# Patient Record
Sex: Female | Born: 1945 | Race: White | Hispanic: No | Marital: Married | Smoking: Never smoker
Health system: Southern US, Academic
[De-identification: ages and names within clinical notes are randomized; demographics above are authoritative.]

## PROBLEM LIST (undated history)

## (undated) DIAGNOSIS — E119 Type 2 diabetes mellitus without complications: Secondary | ICD-10-CM

## (undated) DIAGNOSIS — E782 Mixed hyperlipidemia: Secondary | ICD-10-CM

## (undated) DIAGNOSIS — E559 Vitamin D deficiency, unspecified: Secondary | ICD-10-CM

## (undated) DIAGNOSIS — I10 Essential (primary) hypertension: Secondary | ICD-10-CM

## (undated) DIAGNOSIS — E039 Hypothyroidism, unspecified: Secondary | ICD-10-CM

## (undated) HISTORY — DX: Mixed hyperlipidemia: E78.2

## (undated) HISTORY — DX: Vitamin D deficiency, unspecified: E55.9

## (undated) HISTORY — DX: Type 2 diabetes mellitus without complications: E11.9

## (undated) HISTORY — DX: Essential (primary) hypertension: I10

## (undated) HISTORY — PX: HX BREAST BIOPSY: SHX20

## (undated) HISTORY — DX: Hypothyroidism, unspecified: E03.9

## (undated) HISTORY — PX: HX HYSTERECTOMY: SHX81

---

## 1998-01-07 ENCOUNTER — Other Ambulatory Visit (HOSPITAL_COMMUNITY): Payer: Self-pay

## 2021-05-25 ENCOUNTER — Other Ambulatory Visit (HOSPITAL_COMMUNITY): Payer: Self-pay | Admitting: Family Medicine

## 2021-05-25 ENCOUNTER — Other Ambulatory Visit: Payer: Self-pay

## 2021-05-25 ENCOUNTER — Inpatient Hospital Stay
Admission: RE | Admit: 2021-05-25 | Discharge: 2021-05-25 | Disposition: A | Discharge status: Home / Self Care | Payer: Medicare Other | Source: Ambulatory Visit | Attending: Family Medicine | Admitting: Family Medicine

## 2021-05-25 DIAGNOSIS — R109 Unspecified abdominal pain: Secondary | ICD-10-CM

## 2021-05-25 DIAGNOSIS — R52 Pain, unspecified: Secondary | ICD-10-CM

## 2021-05-30 ENCOUNTER — Other Ambulatory Visit (HOSPITAL_COMMUNITY): Payer: Self-pay | Admitting: Family Medicine

## 2021-05-30 DIAGNOSIS — Z780 Asymptomatic menopausal state: Secondary | ICD-10-CM

## 2021-05-30 DIAGNOSIS — Z1239 Encounter for other screening for malignant neoplasm of breast: Secondary | ICD-10-CM

## 2021-06-08 ENCOUNTER — Encounter (HOSPITAL_COMMUNITY): Payer: Self-pay

## 2021-06-08 ENCOUNTER — Other Ambulatory Visit: Payer: Self-pay

## 2021-06-08 ENCOUNTER — Inpatient Hospital Stay
Admission: RE | Admit: 2021-06-08 | Discharge: 2021-06-08 | Disposition: A | Discharge status: Home / Self Care | Payer: Medicare Other | Source: Ambulatory Visit | Attending: Family Medicine | Admitting: Family Medicine

## 2021-06-08 DIAGNOSIS — Z1239 Encounter for other screening for malignant neoplasm of breast: Secondary | ICD-10-CM

## 2021-06-08 DIAGNOSIS — Z1231 Encounter for screening mammogram for malignant neoplasm of breast: Secondary | ICD-10-CM | POA: Insufficient documentation

## 2021-06-08 DIAGNOSIS — Z780 Asymptomatic menopausal state: Secondary | ICD-10-CM | POA: Insufficient documentation

## 2021-09-06 ENCOUNTER — Emergency Department (HOSPITAL_COMMUNITY): Payer: Medicare Other

## 2021-09-06 ENCOUNTER — Other Ambulatory Visit: Payer: Self-pay

## 2021-09-06 ENCOUNTER — Emergency Department (EMERGENCY_DEPARTMENT_HOSPITAL): Payer: Medicare Other

## 2021-09-06 ENCOUNTER — Emergency Department
Admission: EM | Admit: 2021-09-06 | Discharge: 2021-09-06 | Disposition: A | Discharge status: Home / Self Care | Payer: Medicare Other | Attending: PHYSICIAN ASSISTANT | Admitting: PHYSICIAN ASSISTANT

## 2021-09-06 ENCOUNTER — Encounter (HOSPITAL_COMMUNITY): Payer: Self-pay | Admitting: PHYSICIAN ASSISTANT

## 2021-09-06 DIAGNOSIS — R202 Paresthesia of skin: Secondary | ICD-10-CM

## 2021-09-06 DIAGNOSIS — R9431 Abnormal electrocardiogram [ECG] [EKG]: Secondary | ICD-10-CM | POA: Insufficient documentation

## 2021-09-06 DIAGNOSIS — I493 Ventricular premature depolarization: Secondary | ICD-10-CM

## 2021-09-06 DIAGNOSIS — M5412 Radiculopathy, cervical region: Secondary | ICD-10-CM | POA: Insufficient documentation

## 2021-09-06 DIAGNOSIS — E119 Type 2 diabetes mellitus without complications: Secondary | ICD-10-CM | POA: Insufficient documentation

## 2021-09-06 DIAGNOSIS — R200 Anesthesia of skin: Secondary | ICD-10-CM

## 2021-09-06 DIAGNOSIS — M5030 Other cervical disc degeneration, unspecified cervical region: Secondary | ICD-10-CM | POA: Insufficient documentation

## 2021-09-06 LAB — ECG 12 LEAD
Atrial Rate: 82 {beats}/min
Calculated P Axis: 36 degrees
Calculated R Axis: 45 degrees
Calculated T Axis: 60 degrees
PR Interval: 154 ms
QRS Duration: 76 ms
QT Interval: 394 ms
QTC Calculation: 460 ms
Ventricular rate: 82 {beats}/min

## 2021-09-06 LAB — COMPREHENSIVE METABOLIC PANEL, NON-FASTING
ALBUMIN/GLOBULIN RATIO: 1.8 — ABNORMAL HIGH (ref 0.8–1.4)
ALBUMIN: 4.5 g/dL (ref 3.5–5.7)
ALKALINE PHOSPHATASE: 62 U/L (ref 34–104)
ALT (SGPT): 37 U/L (ref 7–52)
ANION GAP: 11 mmol/L (ref 10–20)
AST (SGOT): 29 U/L (ref 13–39)
BILIRUBIN TOTAL: 0.7 mg/dL (ref 0.3–1.2)
BUN/CREA RATIO: 14 (ref 6–22)
BUN: 11 mg/dL (ref 7–25)
CALCIUM, CORRECTED: 9.6 mg/dL (ref 8.9–10.8)
CALCIUM: 10.1 mg/dL (ref 8.6–10.3)
CHLORIDE: 101 mmol/L (ref 98–107)
CO2 TOTAL: 24 mmol/L (ref 21–31)
CREATININE: 0.81 mg/dL (ref 0.60–1.30)
ESTIMATED GFR: 75 mL/min/{1.73_m2} (ref >59)
GLOBULIN: 2.5 — ABNORMAL LOW (ref 2.9–5.4)
GLUCOSE: 212 mg/dL — ABNORMAL HIGH (ref 74–109)
OSMOLALITY, CALCULATED: 278 mOsm/kg (ref 270–290)
POTASSIUM: 4.3 mmol/L (ref 3.5–5.1)
PROTEIN TOTAL: 7 g/dL (ref 6.4–8.9)
SODIUM: 136 mmol/L (ref 136–145)

## 2021-09-06 LAB — CBC WITH DIFF
BASOPHIL #: 0.1 10*3/uL (ref 0.00–0.30)
BASOPHIL %: 1 % (ref 0–3)
EOSINOPHIL #: 0.2 10*3/uL (ref 0.00–0.80)
EOSINOPHIL %: 3 % (ref 0–7)
HCT: 39.9 % (ref 37.0–47.0)
HGB: 13.5 g/dL (ref 12.5–16.0)
LYMPHOCYTE #: 1.8 10*3/uL (ref 1.10–5.00)
LYMPHOCYTE %: 25 % (ref 25–45)
MCH: 31.4 pg (ref 27.0–32.0)
MCHC: 33.9 g/dL (ref 32.0–36.0)
MCV: 92.7 fL (ref 78.0–99.0)
MONOCYTE #: 0.7 10*3/uL (ref 0.00–1.30)
MONOCYTE %: 10 % (ref 0–12)
MPV: 9.6 fL (ref 7.4–10.4)
NEUTROPHIL #: 4.3 10*3/uL (ref 1.80–8.40)
NEUTROPHIL %: 61 % (ref 40–76)
PLATELETS: 278 10*3/uL (ref 140–440)
RBC: 4.31 10*6/uL (ref 4.20–5.40)
RDW: 13.1 % (ref 11.6–14.8)
WBC: 7.1 10*3/uL (ref 4.0–10.5)
WBCS UNCORRECTED: 7.1 10*3/uL

## 2021-09-06 LAB — BLUE TOP TUBE

## 2021-09-06 LAB — GRAY TOP TUBE

## 2021-09-06 LAB — LIGHT GREEN TOP TUBE

## 2021-09-06 MED ORDER — ONDANSETRON 4 MG DISINTEGRATING TABLET
4.0000 mg | ORAL_TABLET | ORAL | Status: DC
Start: 2021-09-06 — End: 2021-09-06
  Administered 2021-09-06: 0 mg via ORAL

## 2021-09-06 MED ORDER — KETOROLAC 30 MG/ML (1 ML) INJECTION SOLUTION
30.0000 mg | INTRAMUSCULAR | Status: DC
Start: 2021-09-06 — End: 2021-09-06
  Administered 2021-09-06: 30 mg via INTRAMUSCULAR

## 2021-09-06 MED ORDER — ONDANSETRON 4 MG DISINTEGRATING TABLET
ORAL_TABLET | ORAL | Status: AC
Start: 2021-09-06 — End: 2021-09-06
  Filled 2021-09-06: qty 1

## 2021-09-06 MED ORDER — KETOROLAC 30 MG/ML (1 ML) INJECTION SOLUTION
INTRAMUSCULAR | Status: AC
Start: 2021-09-06 — End: 2021-09-06
  Filled 2021-09-06: qty 1

## 2021-09-06 MED ORDER — METHOCARBAMOL 500 MG TABLET
500.0000 mg | ORAL_TABLET | Freq: Four times a day (QID) | ORAL | 0 refills | Status: AC | PRN
Start: 2021-09-06 — End: 2021-09-16

## 2021-09-06 NOTE — ED Nurses Note (Addendum)
Pt to ED complains of numbness around her head, states it feels like cool water. Started several days ago. Radiates to both sides of neck. No migraine Hx. No recent changes in medications. Pt's spouse pastors a church which may contribute as a stressor.

## 2021-09-06 NOTE — ED Provider Notes (Signed)
[facility]  ED Primary Provider Note  History of Present Illness   Chief Complaint   Patient presents with    Numbness     Tammy Kaufman is a 76 y.o. female who had concerns including Numbness.  Arrival: The patient arrived by Private Vehicle    76 year old female presents ambulatory to the ED complaining of facial and upper extremity/shoulder numbness over the last 3-4 days. Patient reports a sensation of "cold water" going over her face into her neck and shoulders. She has had a mild headache associated with this. Denies any recent head injuries. She takes one baby aspirin per day. Denies any peripheral numbness or weakness.     Review of Systems   Pertinent positive and negative ROS as per HPI.  Historical Data   History Reviewed This Encounter: Medical History  Surgical History  Family History  Social History    Physical Exam   ED Triage Vitals [09/06/21 0519]   BP (Non-Invasive) (!) 160/89   Heart Rate 95   Respiratory Rate 18   Temperature 36.5 C (97.7 F)   SpO2 94 %   Weight 72.6 kg (160 lb)   Height 1.575 m (_0 )     Physical Exam  Vitals reviewed.   Constitutional:       General: She is not in acute distress.     Appearance: Normal appearance. She is well-developed.   HENT:      Head: Normocephalic and atraumatic.   Eyes:      Extraocular Movements: Extraocular movements intact.      Pupils: Pupils are equal, round, and reactive to light.   Cardiovascular:      Rate and Rhythm: Normal rate and regular rhythm.      Heart sounds: No murmur heard.  Pulmonary:      Effort: Pulmonary effort is normal. No respiratory distress.      Breath sounds: Normal breath sounds.   Abdominal:      Palpations: Abdomen is soft.      Tenderness: There is no abdominal tenderness.   Musculoskeletal:         General: No swelling.      Cervical back: Neck supple.      Comments: Reproduction of paresthesias of the shoulder region and neck with axial loading of cervical spine   Skin:      General: Skin is warm and dry.      Capillary Refill: Capillary refill takes less than 2 seconds.   Neurological:      General: No focal deficit present.      Mental Status: She is alert.   Psychiatric:         Mood and Affect: Mood normal.     Patient Data     Labs Ordered/Reviewed   COMPREHENSIVE METABOLIC PANEL, NON-FASTING - Abnormal; Notable for the following components:       Result Value    GLUCOSE 212 (*)     ALBUMIN/GLOBULIN RATIO 1.8 (*)     GLOBULIN 2.5 (*)     All other components within normal limits    Narrative:     Estimated Glomerular Filtration Rate (eGFR) is calculated using the CKD-EPI (2021) equation, intended for patients 49 years of age and older. If gender is not documented or "unknown", there will be no eGFR calculation.   CBC/DIFF    Narrative:     The following orders were created for panel order CBC/DIFF.  Procedure  Abnormality         Status                     ---------                               -----------         ------                     CBC WITH YJEH[[id number]]                                    Final result                 Please view results for these tests on the individual orders.   CBC WITH DIFF   EXTRA TUBES    Narrative:     The following orders were created for panel order EXTRA TUBES.  Procedure                               Abnormality         Status                     ---------                               -----------         ------                     BLUE TOP VZCH[[id number]]                                    In process                 LIGHT GREEN TOP TUBE[[id number]]                             In process                 GRAY TOP OINO[[id number]]                                    In process                   Please view results for these tests on the individual orders.   BLUE TOP TUBE   LIGHT GREEN TOP TUBE   GRAY TOP TUBE     CT CERVICAL SPINE WO IV CONTRAST   Final Result by Edi, Radresults In (08/16 0727)   Degenerative disc  disease. No acute compression or subluxation.         One or more dose reduction techniques were used (e.g., Automated exposure control, adjustment of the mA and/or kV according to patient size, use of iterative reconstruction technique).         Radiologist location ID: [place] IV CONTRAST   Final Result by Edi, Radresults In (08/16 0730)  No evidence of an acute intracranial hemorrhage is noted.         Radiologist location ID: [facility] [place] Decision Making        Medical Decision Making  Patient had presented for numbness/paresthesias.  On exam patient still has sensations and the symptoms she is describing is more consistent with paresthesias.  These were reproducible with axial loading.  CT scan of the head shows no acute abnormalities.  CT cervical spine does show multilevel degenerative changes.  Overall symptomatology would be consistent with cervical radiculopathy.  I will write patient a low-dose muscle relaxers.  I did discuss with her steroids however she is diabetic.    Amount and/or Complexity of Data Reviewed  Labs: ordered.  Radiology: ordered.  ECG/medicine tests: ordered.    Risk  Prescription drug management.                Clinical Impression   Cervical radiculopathy (Primary)       Disposition: Discharged

## 2021-09-06 NOTE — Discharge Instructions (Addendum)
Drink plenty of fluids. Continue any at home medications as previously prescribed. Take medications that are prescribed from today's visit as prescribed. Discuss any questions you may have concerning your medications with your pharmacist. Follow up with your regular PCP in the next 2-3 days. Return to the ED if symptoms worsen, change, or do not improve.

## 2021-09-06 NOTE — ED Triage Notes (Signed)
Head numbness, radiating down both sides of neck for 3-4 days.

## 2021-09-06 NOTE — ED Nurses Note (Signed)
Patient discharged home with family.  AVS and new prescription reviewed with patient.  A written copy of the AVS and discharge instructions was give.  Questions sufficiently answered as needed.  Patient encouraged to follow up with PCP as indicated.

## 2021-09-30 ENCOUNTER — Other Ambulatory Visit (HOSPITAL_COMMUNITY): Payer: Self-pay | Admitting: Family Medicine

## 2021-09-30 DIAGNOSIS — R350 Frequency of micturition: Secondary | ICD-10-CM

## 2021-10-01 ENCOUNTER — Emergency Department (HOSPITAL_COMMUNITY): Payer: Medicare Other

## 2021-10-01 ENCOUNTER — Encounter (HOSPITAL_COMMUNITY): Payer: Self-pay | Admitting: Student in an Organized Health Care Education/Training Program

## 2021-10-01 ENCOUNTER — Other Ambulatory Visit: Payer: Self-pay

## 2021-10-01 ENCOUNTER — Emergency Department
Admission: EM | Admit: 2021-10-01 | Discharge: 2021-10-01 | Disposition: A | Discharge status: Home / Self Care | Payer: Medicare Other | Attending: Student in an Organized Health Care Education/Training Program | Admitting: Student in an Organized Health Care Education/Training Program

## 2021-10-01 DIAGNOSIS — U071 COVID-19: Secondary | ICD-10-CM | POA: Insufficient documentation

## 2021-10-01 DIAGNOSIS — R059 Cough, unspecified: Secondary | ICD-10-CM | POA: Insufficient documentation

## 2021-10-01 DIAGNOSIS — R509 Fever, unspecified: Secondary | ICD-10-CM | POA: Insufficient documentation

## 2021-10-01 DIAGNOSIS — R110 Nausea: Secondary | ICD-10-CM | POA: Insufficient documentation

## 2021-10-01 LAB — COVID-19, FLU A/B, RSV RAPID BY PCR
INFLUENZA VIRUS TYPE A: NOT DETECTED
INFLUENZA VIRUS TYPE B: NOT DETECTED
RESPIRATORY SYNCTIAL VIRUS (RSV): NOT DETECTED
SARS-CoV-2: DETECTED — AB

## 2021-10-01 MED ORDER — BENZONATATE 100 MG CAPSULE
100.0000 mg | ORAL_CAPSULE | Freq: Three times a day (TID) | ORAL | 0 refills | Status: DC | PRN
Start: 2021-10-01 — End: 2022-04-11

## 2021-10-01 MED ORDER — ONDANSETRON 4 MG DISINTEGRATING TABLET
4.0000 mg | ORAL_TABLET | Freq: Three times a day (TID) | ORAL | 0 refills | Status: AC | PRN
Start: 2021-10-01 — End: ?

## 2021-10-01 MED ORDER — ACETAMINOPHEN 325 MG TABLET
975.0000 mg | ORAL_TABLET | ORAL | Status: AC
Start: 2021-10-01 — End: 2021-10-01
  Administered 2021-10-01: 975 mg via ORAL

## 2021-10-01 MED ORDER — ONDANSETRON 4 MG DISINTEGRATING TABLET
4.0000 mg | ORAL_TABLET | ORAL | Status: AC
Start: 2021-10-01 — End: 2021-10-01
  Administered 2021-10-01: 4 mg via ORAL

## 2021-10-01 MED ORDER — ONDANSETRON 4 MG DISINTEGRATING TABLET
ORAL_TABLET | ORAL | Status: AC
Start: 2021-10-01 — End: 2021-10-01
  Filled 2021-10-01: qty 1

## 2021-10-01 MED ORDER — ACETAMINOPHEN 325 MG TABLET
ORAL_TABLET | ORAL | Status: AC
Start: 2021-10-01 — End: 2021-10-01
  Filled 2021-10-01: qty 3

## 2021-10-01 NOTE — ED Triage Notes (Signed)
Cough, congestion, fever tmax 104, and body aches since Friday; no ill contacts

## 2021-10-01 NOTE — ED Provider Notes (Signed)
[facility]  ED Primary Provider Note  History of Present Illness   Chief Complaint   Patient presents with    Lethargy    Congestion    Cough     Tammy Kaufman is a 76 y.o. female who had concerns including Lethargy, Congestion, and Cough.  Arrival: The patient arrived by Car    Patient is a 76 year old female arrives today via POV alongside her husband who is in the room next to her with similar symptoms.  Patient reports body aches, fever, congestion, nausea, cough.  Patient reports symptoms at present since Friday.  Patient does report a slight decrease in oral intake to liquids due to nausea.  Patient does not recall any sick exposures.  Patient denies shortness of breath, chest pain, vomiting, diarrhea.    Review of Systems   Pertinent positive and negative ROS as per HPI.  Historical Data   History Reviewed This Encounter: Medical History  Surgical History  Family History  Social History    Physical Exam   ED Triage Vitals [10/01/21 0853]   BP (Non-Invasive) (!) 163/75   Heart Rate 79   Respiratory Rate 20   Temperature (!) 38.1 C (100.5 F)   SpO2 98 %   Weight 71.7 kg (158 lb)   Height 1.575 m (5\' 2" )     Physical Exam  Vitals and nursing note reviewed.   Constitutional:       Appearance: Normal appearance.   HENT:      Head: Normocephalic and atraumatic.   Cardiovascular:      Rate and Rhythm: Normal rate and regular rhythm.      Pulses: Normal pulses.      Heart sounds: Normal heart sounds.   Pulmonary:      Effort: Pulmonary effort is normal.      Breath sounds: Normal breath sounds.   Musculoskeletal:      Cervical back: Normal range of motion.   Skin:     General: Skin is warm and dry.      Capillary Refill: Capillary refill takes less than 2 seconds.   Neurological:      General: No focal deficit present.      Mental Status: She is alert and oriented to person, place, and time. Mental status is at baseline.     Patient Data     Labs Ordered/Reviewed   COVID-19,  FLU A/B, RSV RAPID BY PCR - Abnormal; Notable for the following components:       Result Value    SARS-CoV-2 Detected (*)     All other components within normal limits    Narrative:     Results are for the simultaneous qualitative identification of SARS-CoV-2 (formerly 2019-nCoV), Influenza A, Influenza B, and RSV RNA. These etiologic agents are generally detectable in nasopharyngeal and nasal swabs during the ACUTE PHASE of infection. Hence, this test is intended to be performed on respiratory specimens collected from individuals with signs and symptoms of upper respiratory tract infection who meet Centers for Disease Control and Prevention (CDC) clinical and/or epidemiological criteria for Coronavirus Disease 2019 (COVID-19) testing. CDC COVID-19 criteria for testing on human specimens is available at [facility] webpage information for Healthcare Professionals: Coronavirus Disease 2019 (COVID-19) ([facility]).     False-negative results may occur if the virus has genomic mutations, insertions, deletions, or rearrangements or if performed very early in the course of illness. Otherwise, negative results indicate virus specific RNA targets are not detected, however negative  results do not preclude SARS-CoV-2 infection/COVID-19, Influenza, or Respiratory syncytial virus infection. Results should not be used as the sole basis for patient management decisions. Negative results must be combined with clinical observations, patient history, and epidemiological information. If upper respiratory tract infection is still suspected based on exposure history together with other clinical findings, re-testing should be considered.    Disclaimer:   This assay has been authorized by FDA under an Emergency Use Authorization for use in laboratories certified under the Clinical Laboratory Improvement Amendments of 1988 (CLIA), 42 U.S.C. 669 048 1851, to perform high complexity tests. The impacts of  vaccines, antiviral therapeutics, antibiotics, chemotherapeutic or immunosuppressant drugs have not been evaluated.     Test methodology:   Cepheid Xpert Xpress SARS-CoV-2/Flu/RSV Assay real-time polymerase chain reaction (RT-PCR) test on the GeneXpert Dx and Xpert Xpress systems.     XR AP MOBILE CHEST   Final Result by Edi, Radresults In (09/10 1113)   NO ACUTE FINDINGS.         Radiologist location ID: [id number]           Medical Decision Making        Medical Decision Making  Patient was diagnosed with COVID-19.  Chest x-ray findings no acute changes or concerns.  At this time patient was given 975 mg of Tylenol as well as Zofran in the emergency department.  Tessalon Perles and Zofran prescription given upon discharge.  Patient was given strict ED return precautions.  Patient advised symptomatic treatment.  All questions and concerns addressed.    Amount and/or Complexity of Data Reviewed  Radiology: ordered.    Risk  OTC drugs.  Prescription drug management.             Medications Administered in the ED   acetaminophen (TYLENOL) tablet (975 mg Oral Given 10/01/21 1037)   ondansetron (ZOFRAN ODT) rapid dissolve tablet (4 mg Oral Given 10/01/21 1038)     Clinical Impression   COVID-19 (Primary)       Disposition: Discharged

## 2021-10-01 NOTE — Discharge Instructions (Addendum)
You were diagnosed with COVID-19 today.  I do advise you to take Tylenol and/or Motrin as needed for pain and fever control.  Be sure to drink plenty of fluids.  I have included a prescription for Zofran for nausea as well as Tessalon Perles for your cough.  Take as directed as needed.  Follow up with your PCP as needed.  If at anytime you develop worsening shortness of breath, uncontrolled fever, decreased intake to liquids or any other concerns you may have return emergency department.

## 2021-10-23 ENCOUNTER — Inpatient Hospital Stay
Admission: RE | Admit: 2021-10-23 | Discharge: 2021-10-23 | Disposition: A | Discharge status: Home / Self Care | Payer: Medicare Other | Source: Ambulatory Visit | Attending: Family Medicine | Admitting: Family Medicine

## 2021-10-23 ENCOUNTER — Other Ambulatory Visit: Payer: Self-pay

## 2021-10-23 DIAGNOSIS — Z1239 Encounter for other screening for malignant neoplasm of breast: Secondary | ICD-10-CM

## 2021-10-23 DIAGNOSIS — Z780 Asymptomatic menopausal state: Secondary | ICD-10-CM | POA: Insufficient documentation

## 2021-11-03 ENCOUNTER — Encounter (HOSPITAL_COMMUNITY): Payer: Self-pay | Admitting: Family Medicine

## 2021-11-03 DIAGNOSIS — R350 Frequency of micturition: Secondary | ICD-10-CM

## 2021-11-08 ENCOUNTER — Encounter (HOSPITAL_COMMUNITY): Payer: Self-pay | Admitting: Family Medicine

## 2021-11-08 DIAGNOSIS — R350 Frequency of micturition: Secondary | ICD-10-CM

## 2021-11-13 ENCOUNTER — Encounter (HOSPITAL_COMMUNITY): Payer: Self-pay | Admitting: Family Medicine

## 2021-11-13 DIAGNOSIS — Z8051 Family history of malignant neoplasm of kidney: Secondary | ICD-10-CM

## 2021-11-14 ENCOUNTER — Other Ambulatory Visit (HOSPITAL_COMMUNITY): Payer: Self-pay | Admitting: Family Medicine

## 2021-11-14 ENCOUNTER — Encounter (HOSPITAL_COMMUNITY): Payer: Self-pay | Admitting: Family Medicine

## 2021-11-14 DIAGNOSIS — N390 Urinary tract infection, site not specified: Secondary | ICD-10-CM

## 2021-11-14 DIAGNOSIS — R350 Frequency of micturition: Secondary | ICD-10-CM

## 2021-12-12 ENCOUNTER — Ambulatory Visit (INDEPENDENT_AMBULATORY_CARE_PROVIDER_SITE_OTHER): Payer: Self-pay | Admitting: Surgery

## 2021-12-25 ENCOUNTER — Encounter (INDEPENDENT_AMBULATORY_CARE_PROVIDER_SITE_OTHER): Payer: Self-pay | Admitting: Surgery

## 2021-12-28 ENCOUNTER — Ambulatory Visit (INDEPENDENT_AMBULATORY_CARE_PROVIDER_SITE_OTHER): Payer: Medicare Other | Admitting: Surgery

## 2021-12-28 ENCOUNTER — Encounter (INDEPENDENT_AMBULATORY_CARE_PROVIDER_SITE_OTHER): Payer: Self-pay | Admitting: Surgery

## 2021-12-28 ENCOUNTER — Other Ambulatory Visit: Payer: Self-pay

## 2021-12-28 VITALS — BP 145/96 | HR 87 | Temp 97.8°F | Ht 62.0 in | Wt 158.6 lb

## 2021-12-28 DIAGNOSIS — Z1211 Encounter for screening for malignant neoplasm of colon: Secondary | ICD-10-CM

## 2021-12-28 MED ORDER — SUTAB 1.479-0.188-0.225 GRAM TABLET
ORAL_TABLET | ORAL | 0 refills | Status: DC
Start: 2021-12-28 — End: 2022-04-11

## 2021-12-28 NOTE — Progress Notes (Signed)
GENERAL SURGERY, [facility]  [address]  [place] [address]    History and Physical     Name: Tammy Kaufman MRN:  [id number]   Date: 12/28/2021 Age: 76 y.o.            Reason for Visit: Colonoscopy    History of Present Illness  [name] presents today for screening colonoscopy.  It has been over 10 years since her last colonoscopy.    Positive diabetes:  Metformin  Negative blood thinner, negative family history colon cancer      Review of the result(s) of each unique test:  Patient underwent diagnostic testing ( none ) prior to this dates visit.  I have personally reviewed the results and that serves as a component of the medical decision making for this encounter       Review of prior external note(s) from each unique source:  Patients referral to this office including a recent assessment by the referring provider.  This was reviewed by me for this unique office visit for the indication and intent of the referral as well as any pertinent medical or surgical history relevant to the patients independent evaluation by me today.      Patient History  Past Medical History:   Diagnosis Date    Diabetes mellitus, type 2 (CMS HCC)     Hypertension     Hypothyroidism     Mixed hyperlipidemia     Vitamin D deficiency          Past Surgical History:   Procedure Laterality Date    HX BREAST BIOPSY Left     negative   2016    HX HYSTERECTOMY      1994         Current Outpatient Medications   Medication Sig    benzonatate (TESSALON) 100 mg Oral Capsule Take 1 Capsule (100 mg total) by mouth Every 8 hours as needed for Cough (Patient not taking: Reported on 12/28/2021)    hydroCHLOROthiazide (HYDRODIURIL) 25 mg Oral Tablet     levothyroxine (SYNTHROID) 112 mcg Oral Tablet Take 1 Tablet (112 mcg total) by mouth Once a day    losartan (COZAAR) 100 mg Oral Tablet Take 1 Tablet (100 mg total) by mouth Once a day    metFORMIN (GLUCOPHAGE) 500 mg Oral Tablet     ondansetron (ZOFRAN ODT)  4 mg Oral Tablet, Rapid Dissolve Take 1 Tablet (4 mg total) by mouth Every 8 hours as needed for Nausea/Vomiting    sod sulf-pot chloride-mag sulf (SUTAB) 1.479-0.188- 0.225 gram Oral Tablet Take 12 tablets with specified amount of water the evening prior to colonoscopy, as directed on the package. Take an additional 12 tablets with specified amount of water the morning of the colonoscopy, as directed on the package. Complete all tablets and required water at least 2 hours prior to procedure.    triamcinolone acetonide 0.5 % Cream APPLY THIN COAT TO AFFECTED AREA TWICE A DAY     Allergies   Allergen Reactions    Ciprofloxacin Rash    Bactrim [Sulfamethoxazole-Trimethoprim]     Hydrocortisone     Zetia [Ezetimibe]     Atorvastatin Swelling    Cephalexin Nausea/ Vomiting    Macrobid [Nitrofurantoin Monohyd/M-Cryst] Nausea/ Vomiting     Family Medical History:       Problem Relation (Age of Onset)    No Known Problems Mother, Father, Sister, Brother, Maternal Grandmother, Maternal Grandfather, Paternal Grandmother, Paternal Grandfather,  Daughter, Son, Maternal Aunt, Maternal Uncle, Paternal Aunt, Paternal Uncle, Other            Social History     Tobacco Use    Smoking status: Never    Smokeless tobacco: Never            Physical Examination:  Vitals:    12/28/21 1310   BP: (!) 145/96   Pulse: 87   Temp: 36.6 C (97.8 F)   SpO2: 93%   Weight: 71.9 kg (158 lb 9.6 oz)   Height: 1.575 m (5\' 2" )   BMI: 29.07        General: appropriate for age. in no acute distress.    Vital signs are present above and have been reviewed by me     HEENT: Atraumatic, Normocephalic. PERRLA. EOMI. Nose clear. Throat clear    Lungs: Nonlabored breathing with symmetric expansion. Clear to auscultation bilaterally    Heart:Regular wth respect to rate and rythmn.    Abdomen:Soft. Nontender. Nondistended and benign    Extremities: Grossly normal. No major deformities     Neuro:  Grossly normal motor and sensory function    Psychiatric: Alert and  oriented to person, place, and time. affect appropriate      Assessment and Plan  Colonoscopy scheduled for 04/11/2022 at 8:00 a.m.      Follow Up:  No follow-ups on file.      ICD-10-CM    1. Encounter for screening colonoscopy  Z12.11           Makyia Erxleben B Kha Hari, MD ,MBA,FACS    I appreciate the opportunity to be involved in the care of your patients.  If you have any questions or concerns regarding this encounter, please do not hesitate to contact me at your convenience.      This note may have been partially generated using MModal Fluency Direct system, and there may be some incorrect words, spellings, and punctuation that were not noted in checking the note before saving, though effort was made to avoid such errors.

## 2022-01-16 ENCOUNTER — Other Ambulatory Visit: Payer: Self-pay

## 2022-01-16 ENCOUNTER — Inpatient Hospital Stay
Admission: RE | Admit: 2022-01-16 | Discharge: 2022-01-16 | Disposition: A | Discharge status: Home / Self Care | Payer: Medicare Other | Source: Ambulatory Visit | Attending: Family Medicine | Admitting: Family Medicine

## 2022-01-16 DIAGNOSIS — N390 Urinary tract infection, site not specified: Secondary | ICD-10-CM | POA: Insufficient documentation

## 2022-01-18 ENCOUNTER — Inpatient Hospital Stay
Admission: RE | Admit: 2022-01-18 | Discharge: 2022-01-18 | Disposition: A | Discharge status: Home / Self Care | Payer: Medicare Other | Source: Ambulatory Visit | Attending: Family Medicine | Admitting: Family Medicine

## 2022-01-18 ENCOUNTER — Inpatient Hospital Stay (HOSPITAL_COMMUNITY)
Admission: RE | Admit: 2022-01-18 | Discharge: 2022-01-18 | Disposition: A | Discharge status: Home / Self Care | Payer: Medicare Other | Source: Ambulatory Visit | Attending: Family Medicine | Admitting: Family Medicine

## 2022-01-18 ENCOUNTER — Other Ambulatory Visit: Payer: Self-pay

## 2022-01-18 ENCOUNTER — Other Ambulatory Visit (HOSPITAL_COMMUNITY): Payer: Self-pay | Admitting: Family Medicine

## 2022-01-18 DIAGNOSIS — M549 Dorsalgia, unspecified: Secondary | ICD-10-CM

## 2022-01-19 ENCOUNTER — Other Ambulatory Visit (HOSPITAL_COMMUNITY): Payer: Self-pay | Admitting: Family Medicine

## 2022-01-19 DIAGNOSIS — M549 Dorsalgia, unspecified: Secondary | ICD-10-CM

## 2022-01-19 DIAGNOSIS — R42 Dizziness and giddiness: Secondary | ICD-10-CM

## 2022-02-12 IMAGING — MR MRI LUMBAR SPINE WITHOUT CONTRAST
4 of 6 series · 28 of 48 positions shown · IV contrast (gadolinium)
Comparison: None available.

﻿EXAM:  [id number]   MRI LUMBAR SPINE WITHOUT CONTRAST
INDICATION: 76-year-old with symptoms of persistent low back pain and radicular symptoms to the right leg.  No history of back surgery or malignancy.
TECHNIQUE: Multiplanar, multisequential MRI of the lumbosacral spine was performed without gadolinium contrast.

[Series 8: T2 · sagittal · 4.0mm · 0.94mm/px · 7 of 15 slices shown (1 of 3)]
[im 1/15]
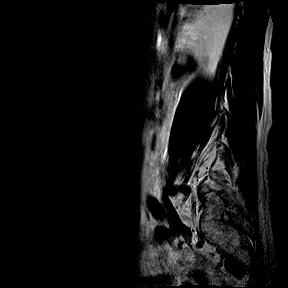
[im 3/15]
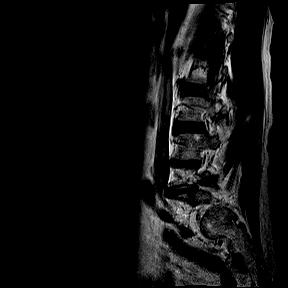
[im 5/15]
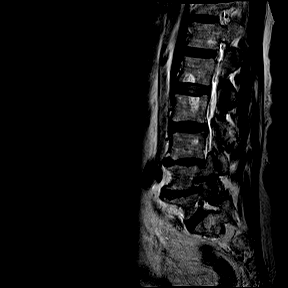
[im 8/15]
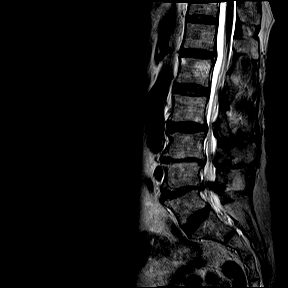
[im 10/15]
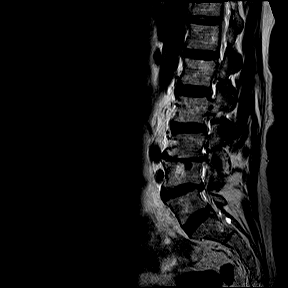
[im 12/15]
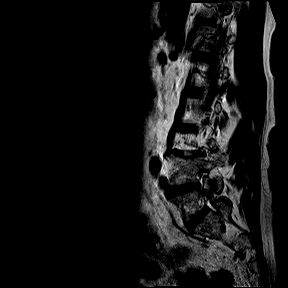
[im 15/15]
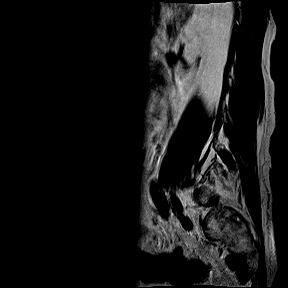

[Series 9: T1 · sagittal · 4.0mm · 0.94mm/px · 3 of 15 slices shown]
[im 3/15]
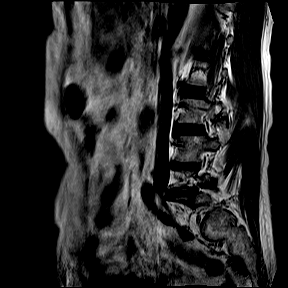
[im 8/15]
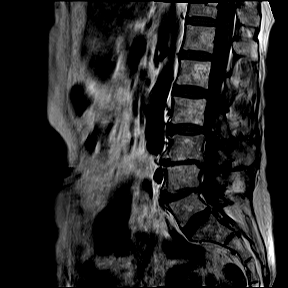
[im 12/15]
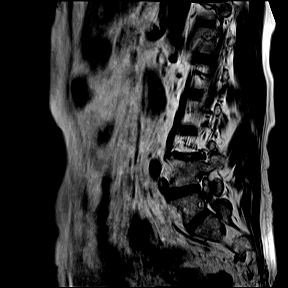

[Series 11: T2 · axial · 4.0mm · 0.52mm/px · z∈[-125,+68]mm · 9 of 19 slices shown (2 of 3)]
[im 1/19]
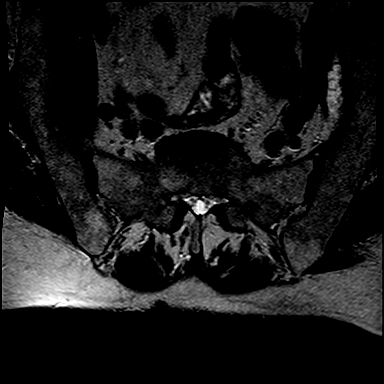
[im 3/19]
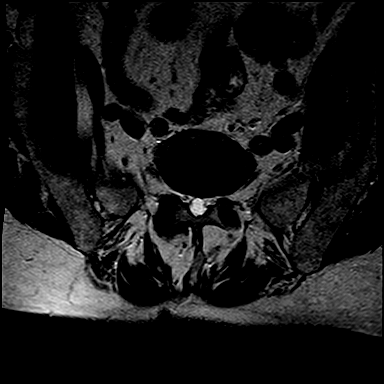
[im 5/19]
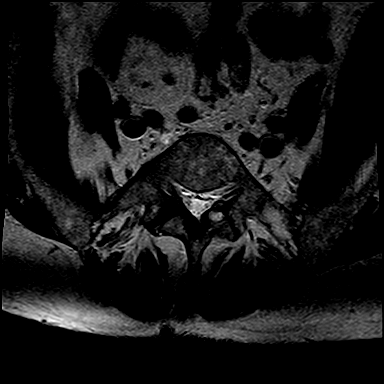
[im 7/19]
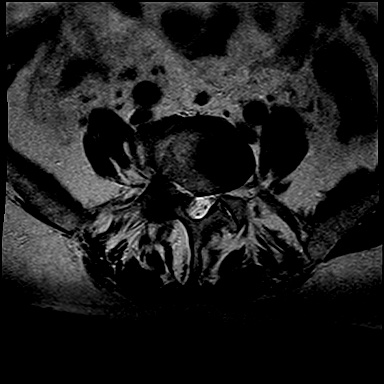
[im 10/19]
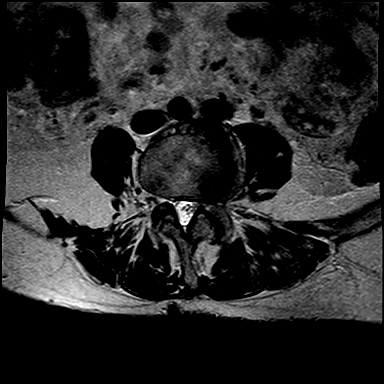
[im 12/19]
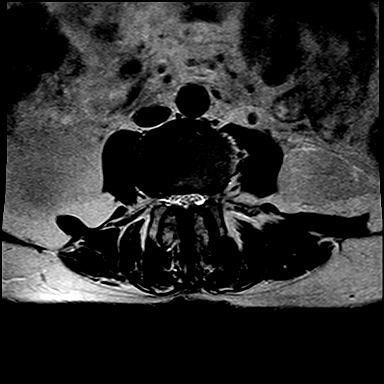
[im 14/19]
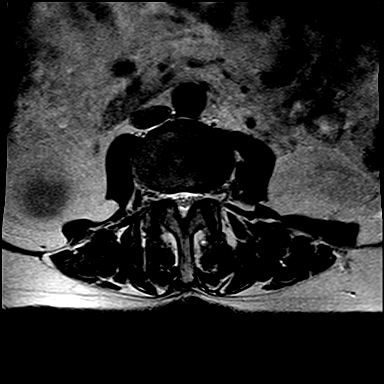
[im 16/19]
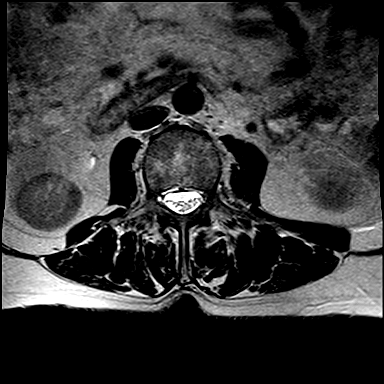
[im 19/19]
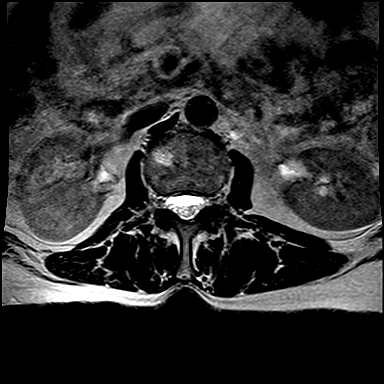

[Series 13: T2 · coronal · 5.0mm · 0.82mm/px · 9 of 18 slices shown (3 of 3)]
[im 1/18]
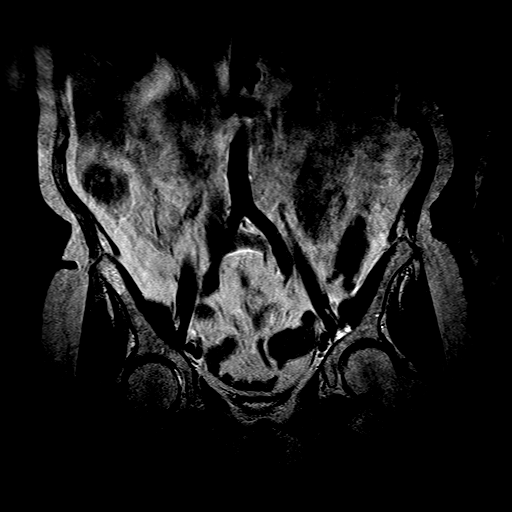
[im 3/18]
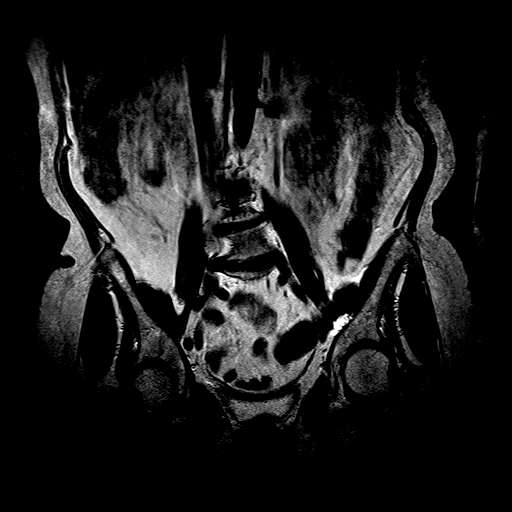
[im 5/18]
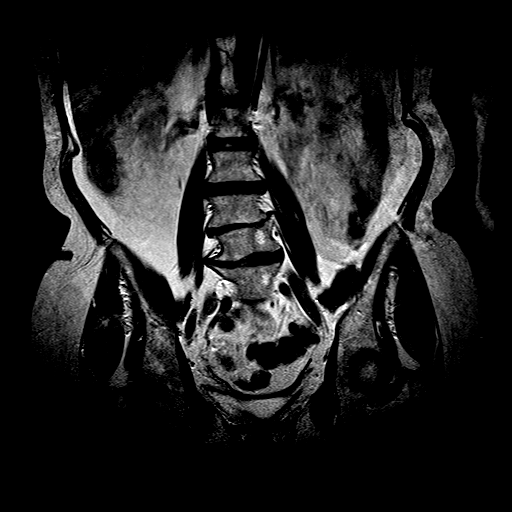
[im 7/18]
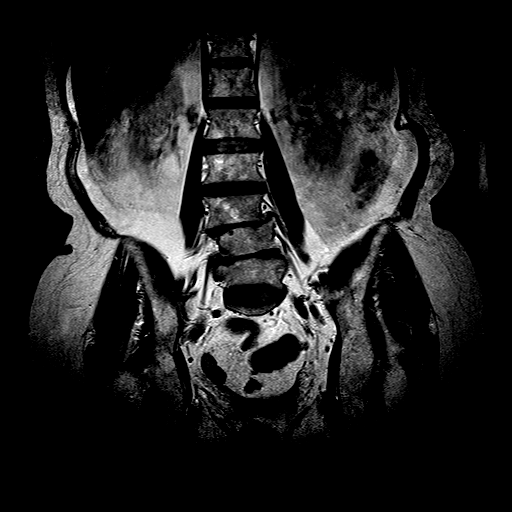
[im 9/18]
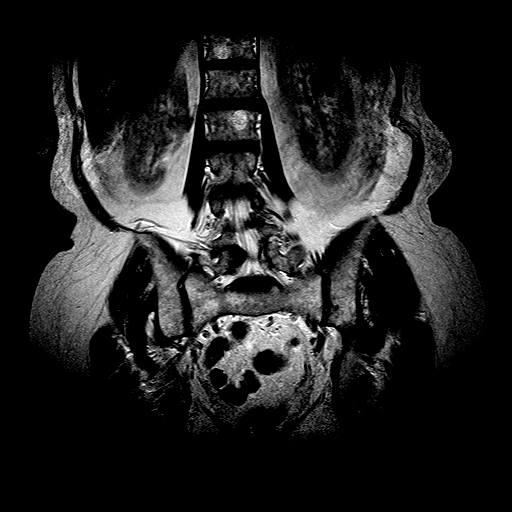
[im 11/18]
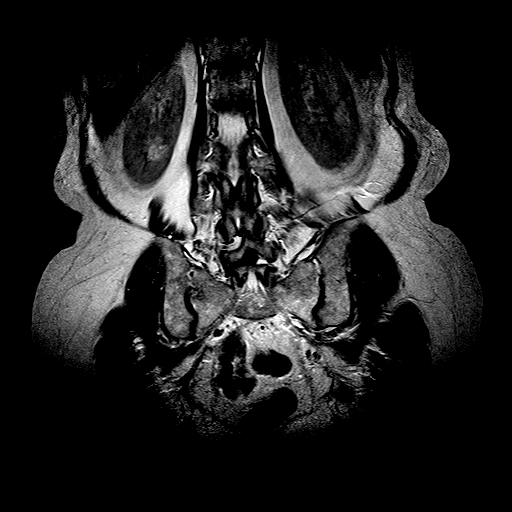
[im 13/18]
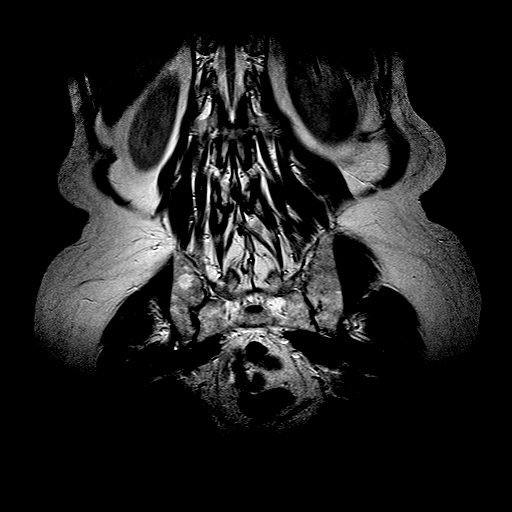
[im 15/18]
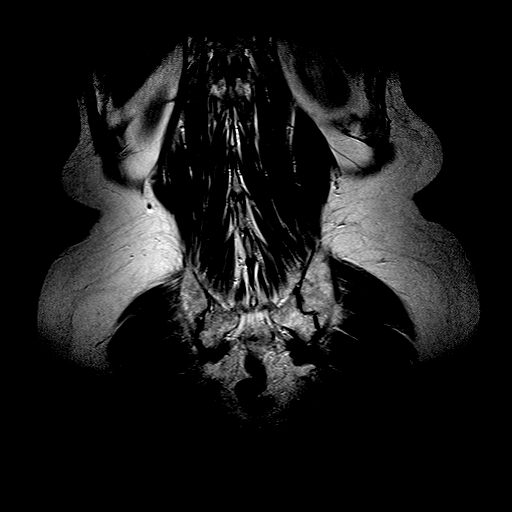
[im 18/18]
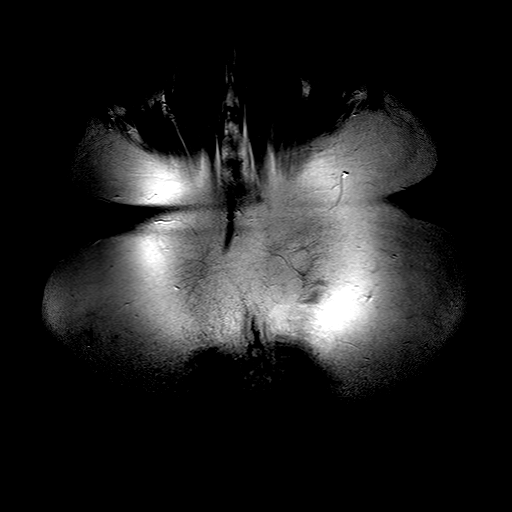

[28 of 48 positions shown; findings below may reference images not displayed]

FINDINGS: No acute bony lesions of lumbar spine are seen. Lower spinal cord and cauda equina are normal.

At L1-2 level, mild to moderate facet arthropathy causing mild compromise of both lateral recesses on the dorsal aspect. 

At L2-3 level, significant degenerative disc disease is noted with significant facet arthropathy and hypertrophy of dorsal ligaments causing significant compromise of thecal sac and both lateral recesses. AP diameter of the thecal sac in the midline measures 5.9 mm. 

At L3-4 level, severe degenerative disc disease with loss of disc space is noted with minimal retrolisthesis and significant facet arthropathy especially on the right side.  Combination of the facet arthropathy and degenerative disc changes are causing significant compromise of right lateral recess and neural foramen and moderately significant compromise of thecal sac and left lateral recess.  AP diameter of thecal sac in the midline measures 6 mm.

At L4-5 level, severe degenerative disc disease is noted with minimal retrolisthesis. Severe facet arthropathy is noted especially on the right side with hypertrophy of ligaments. Combination causes severe compromise of right lateral recess and right neural foramen and significant compromise of left neural foramen and moderate compromise of thecal sac. AP diameter of thecal sac in the midline measures 7.9 mm.

At L5-S1 level, mild foraminal narrowing due to degenerative disc changes.

Paravertebral soft tissues are unremarkable.
IMPRESSION: 1. No acute bone changes of lumbar vertebrae. 

2. At L2-3 level, significant degenerative disc disease is noted with significant facet arthropathy and hypertrophy of dorsal ligaments causing significant compromise of thecal sac and both lateral recesses. AP diameter of the thecal sac in the midline measures 5.9 mm. 

3 At L3-4 level, severe degenerative disc disease with loss of disc space is noted with minimal retrolisthesis and significant facet arthropathy especially on the right side.  Combination of the facet arthropathy and degenerative disc changes are causing significant compromise of right lateral recess and neural foramen and moderately significant compromise of thecal sac and left lateral recess.  AP diameter of thecal sac in the midline measures 6 mm.

4. At L4-5 level, severe degenerative disc disease is noted with minimal retrolisthesis. Severe facet arthropathy is noted especially on the right side with hypertrophy of ligaments. Combination causes severe compromise of right lateral recess and right neural foramen and significant compromise of left neural foramen and moderate compromise of thecal sac. AP diameter of thecal sac in the midline measures 7.9 mm.

5. Findings at other disc levels are described above in detail.

## 2022-03-27 ENCOUNTER — Other Ambulatory Visit: Payer: Self-pay

## 2022-03-27 ENCOUNTER — Inpatient Hospital Stay
Admission: RE | Admit: 2022-03-27 | Discharge: 2022-03-27 | Disposition: A | Discharge status: Home / Self Care | Payer: Medicare Other | Source: Ambulatory Visit | Attending: Family Medicine | Admitting: Family Medicine

## 2022-03-27 DIAGNOSIS — R42 Dizziness and giddiness: Secondary | ICD-10-CM | POA: Insufficient documentation

## 2022-04-11 ENCOUNTER — Encounter (HOSPITAL_COMMUNITY)
Admission: RE | Disposition: A | Discharge status: Home / Self Care | Payer: Self-pay | Source: Ambulatory Visit | Attending: Surgery

## 2022-04-11 ENCOUNTER — Inpatient Hospital Stay
Admission: RE | Admit: 2022-04-11 | Discharge: 2022-04-11 | Disposition: A | Discharge status: Home / Self Care | Payer: Medicare Other | Source: Ambulatory Visit | Attending: Surgery | Admitting: Surgery

## 2022-04-11 ENCOUNTER — Ambulatory Visit (HOSPITAL_COMMUNITY): Payer: Medicare Other | Admitting: Certified Registered"

## 2022-04-11 ENCOUNTER — Encounter (HOSPITAL_COMMUNITY): Payer: Self-pay | Admitting: Surgery

## 2022-04-11 ENCOUNTER — Other Ambulatory Visit: Payer: Self-pay

## 2022-04-11 ENCOUNTER — Encounter (HOSPITAL_COMMUNITY): Payer: Medicare Other | Admitting: Surgery

## 2022-04-11 DIAGNOSIS — K219 Gastro-esophageal reflux disease without esophagitis: Secondary | ICD-10-CM | POA: Insufficient documentation

## 2022-04-11 DIAGNOSIS — I10 Essential (primary) hypertension: Secondary | ICD-10-CM | POA: Insufficient documentation

## 2022-04-11 DIAGNOSIS — K5730 Diverticulosis of large intestine without perforation or abscess without bleeding: Secondary | ICD-10-CM | POA: Insufficient documentation

## 2022-04-11 DIAGNOSIS — E039 Hypothyroidism, unspecified: Secondary | ICD-10-CM | POA: Insufficient documentation

## 2022-04-11 DIAGNOSIS — Z7984 Long term (current) use of oral hypoglycemic drugs: Secondary | ICD-10-CM | POA: Insufficient documentation

## 2022-04-11 DIAGNOSIS — K641 Second degree hemorrhoids: Secondary | ICD-10-CM | POA: Insufficient documentation

## 2022-04-11 DIAGNOSIS — E785 Hyperlipidemia, unspecified: Secondary | ICD-10-CM | POA: Insufficient documentation

## 2022-04-11 DIAGNOSIS — Z1211 Encounter for screening for malignant neoplasm of colon: Secondary | ICD-10-CM

## 2022-04-11 DIAGNOSIS — E119 Type 2 diabetes mellitus without complications: Secondary | ICD-10-CM | POA: Insufficient documentation

## 2022-04-11 DIAGNOSIS — K6389 Other specified diseases of intestine: Secondary | ICD-10-CM | POA: Insufficient documentation

## 2022-04-11 SURGERY — COLONOSCOPY
Anesthesia: General | Wound class: Clean Contaminated Wounds-The respiratory, GI, Genital, or urinary

## 2022-04-11 MED ORDER — PROPOFOL 10 MG/ML IV BOLUS
INJECTION | Freq: Once | INTRAVENOUS | Status: DC | PRN
Start: 2022-04-11 — End: 2022-04-11
  Administered 2022-04-11: 50 mg via INTRAVENOUS
  Administered 2022-04-11 (×2): 100 mg via INTRAVENOUS

## 2022-04-11 MED ORDER — LIDOCAINE (PF) 100 MG/5 ML (2 %) INTRAVENOUS SYRINGE
INJECTION | Freq: Once | INTRAVENOUS | Status: DC | PRN
Start: 2022-04-11 — End: 2022-04-11
  Administered 2022-04-11: 100 mg via INTRAVENOUS

## 2022-04-11 MED ORDER — DEXTROSE 5 % AND LACTATED RINGERS INTRAVENOUS SOLUTION
INTRAVENOUS | Status: DC | PRN
Start: 2022-04-11 — End: 2022-04-11
  Administered 2022-04-11: 0 via INTRAVENOUS

## 2022-04-11 NOTE — Anesthesia Preprocedure Evaluation (Signed)
ANESTHESIA PRE-OP EVALUATION  Planned Procedure: COLONOSCOPY  Review of Systems     anesthesia history negative     patient summary reviewed  nursing notes reviewed        Pulmonary  negative pulmonary ROS,    Cardiovascular    Hypertension, dysrhythmias, ECG reviewed and hyperlipidemia ,No angina,  Exercise Tolerance: > or = 4 METS        GI/Hepatic/Renal    GERD        Endo/Other    hypothyroidism and drug induced coagulopathy,   type 2 diabetes/ controlled with oral medications    Neuro/Psych/MS   negative neuro/psych ROS,      Cancer    negative hematology/oncology ROS,               Physical Assessment      Airway       Mallampati: III    TM distance: <3 FB    Mouth Opening: fair.            Dental           (+) upper dentures, lower dentures           Pulmonary    Breath sounds clear to auscultation       Cardiovascular    Rhythm: regular  Rate: Normal       Other findings          Plan  ASA 3     Planned anesthesia type: general     general intravenous                    Intravenous induction       Anesthetic plan and risks discussed with patient  signed consent obtained          Patient's NPO status is appropriate for Anesthesia.

## 2022-04-11 NOTE — H&P (Signed)
[facility]  General Surgery  History and Physical    Date of Service:  04/11/2022  Tammy, Kaufman, 77 y.o. female  Date of Admission:  04/11/2022  Date of Birth:  Nov 25, 1945  PCP: Peri Maris, DO    Reason for admission:  Colonoscopy    HPI:  Tammy Kaufman is a 77 y.o. White female who is admitted for COLON SCREENING       Ms. Rockoff presents today for screening colonoscopy.  It has been over 10 years since her last colonoscopy.     Positive diabetes:  Metformin  Negative blood thinner, negative family history colon cancer        Review of the result(s) of each unique test:  Patient underwent diagnostic testing ( none ) prior to this dates visit.  I have personally reviewed the results and that serves as a component of the medical decision making for this encounter        Review of prior external note(s) from each unique source:  Patients referral to this office including a recent assessment by the referring provider.  This was reviewed by me for this unique office visit for the indication and intent of the referral as well as any pertinent medical or surgical history relevant to the patients independent evaluation by me today.  Past Medical History:   Diagnosis Date    Diabetes mellitus, type 2 (CMS HCC)     Hypertension     Hypothyroidism     Mixed hyperlipidemia     Vitamin D deficiency       Past Surgical History:   Procedure Laterality Date    HX BREAST BIOPSY Left     negative   2016    HX HYSTERECTOMY      1994      Social History     Tobacco Use    Smoking status: Never    Smokeless tobacco: Never       Family Medical History:       Problem Relation (Age of Onset)    No Known Problems Mother, Father, Sister, Brother, Maternal Grandmother, Maternal Grandfather, Paternal Grandmother, Paternal Grandfather, Daughter, Son, Maternal Aunt, Maternal Uncle, Paternal 37, Paternal Uncle, Other           Medications Prior to Admission       Prescriptions    hydroCHLOROthiazide (HYDRODIURIL)  25 mg Oral Tablet    levothyroxine (SYNTHROID) 112 mcg Oral Tablet    Take 1 Tablet (112 mcg total) by mouth Once a day    losartan (COZAAR) 100 mg Oral Tablet    Take 1 Tablet (100 mg total) by mouth Once a day    metFORMIN (GLUCOPHAGE) 500 mg Oral Tablet    ondansetron (ZOFRAN ODT) 4 mg Oral Tablet, Rapid Dissolve    Take 1 Tablet (4 mg total) by mouth Every 8 hours as needed for Nausea/Vomiting    triamcinolone acetonide 0.5 % Cream    APPLY THIN COAT TO AFFECTED AREA TWICE A DAY           Allergies   Allergen Reactions    Ciprofloxacin Rash    Bactrim [Sulfamethoxazole-Trimethoprim]     Hydrocortisone     Zetia [Ezetimibe]     Atorvastatin Swelling    Cephalexin Nausea/ Vomiting    Macrobid [Nitrofurantoin Monohyd/M-Cryst] Nausea/ Vomiting          Patient Vitals for the past 24 hrs:   BP Temp Pulse Resp SpO2 Height Weight  04/11/22 0700 (!) 149/84 36.8 C (98.3 F) 92 20 96 % 1.575 m (5\' 2" ) 72.1 kg (159 lb)          General: appropriate for age. in no acute distress.    Vital signs are present above and have been reviewed by me     HEENT: Atraumatic, Normocephalic. PERRLA, EOMI. Nose clear. Throat clear.    Lungs: Nonlabored breathing with symmetric expansion.  Clear to auscultation bilaterally    Heart:Regular wth respect to rate and rythmn.    Abdomen:Soft. Nontender. Nondistended and benign    Extremities:  Grossly normal with good range of motion and no major deformities.    Neuro:  Grossly normal motor and sensory function. CN's II through XII intact.    Psychiatric: Alert and oriented to person, place, and time. affect appropriate    Laboratory Data:     No results found for any visits on 04/11/22 (from the past 24 hour(s)).    Imaging Studies:    No orders to display        Assessment/Plan:  COLON SCREENING    Colonoscopy scheduled for Wednesday April 11, 2022    Discussed indications, risks, and benefits of colonoscopy with possible biopsy/polypectomy with the patient.  Discussed the possibility of  polypectomy, biopsies, and possible repeat examinations.  Risks include bleeding, sedation risks, possibility of missed diagnosis of polyp or malignancy, and remote possibilities of perforation and death.  All questions were answered, and informed consent was clearly obtained.    This note was partially created using voice recognition software and is inherently subject to errors including those of syntax and "sound alike " substitutions which may escape proof reading. In such instances, original meaning may be extrapolated by contextual derivation.    Willow Ora, MD, MBA, FACS

## 2022-04-11 NOTE — Discharge Instructions (Signed)
SURGICAL DISCHARGE INSTRUCTIONS     Dr. Renette Butters, Gene B, MD  performed your COLONOSCOPY today at the [facility]:  Monday through Friday from 8 a.m. - 4 p.m.: [phone]    For T&D: [phone]  Between 4 p.m. - 8 a.m., weekends and holidays:  Call ER [phone]    PLEASE SEE WRITTEN HANDOUTS AS DISCUSSED BY YOUR NURSE:  HEMORRHOIDS, DIVERTICULOSIS, HIGH FIBER DIET    SIGNS AND SYMPTOMS OF A WOUND / INCISION INFECTION   Be sure to watch for the following:  Increase in redness or red streaks near or around the wound or incision.  Increase in pain that is intense or severe and cannot be relieved by the pain medication that your doctor has given you.  Increase in swelling that cannot be relieved by elevation of a body part, or by applying ice, if permitted.  Increase in drainage, or if yellow / green in color and smells bad. This could be on a dressing or a cast.  Increase in fever for longer than 24 hours, or an increase that is higher than 101 degrees Fahrenheit (normal body temperature is 98 degrees Fahrenheit). The incision may feel warm to the touch.    **CALL YOUR DOCTOR IF ONE OR MORE OF THESE SIGNS / SYMPTOMS SHOULD OCCUR.    ANESTHESIA INFORMATION   ANESTHESIA -- ADULT PATIENTS:  You have received intravenous sedation / general anesthesia, and you may feel drowsy and light-headed for several hours. You may even experience some forgetfulness of the procedure. DO NOT DRIVE A MOTOR VEHICLE or perform any activity requiring complete alertness or coordination until you feel fully awake in about 24-48 hours. Do not drink alcoholic beverages for at least 24 hours. Do not stay alone, you must have a responsible adult available to be with you. You may also experience a dry mouth or nausea for 24 hours. This is a normal side effect and will disappear as the effects of the medication wear off.    REMEMBER   If you experience any difficulty breathing, chest  pain, bleeding that you feel is excessive, persistent nausea or vomiting or for any other concerns:  Call your physician Dr.  Renette Butters, Bruna Potter, MD   at [phone] . You may also ask to have the general doctor on call paged. They are available to you 24 hours a day.      SPECIAL INSTRUCTIONS / COMMENTS   POST-OP DIAGNOSIS--TETHERING OF SIGMOID COLON, GRADE 2 HEMORRHOIDS, MODERATE SIGMOID DIVERTICULOSIS SCATTERED THROUGHOUT  REST TODAY--DO NOT DRIVE OR OPERATE ELECTRICAL EQUIPMENT OR SIGN LEGAL DOCUMENTS FOR 24 HOURS  RETURN TO SEE DR GENE DUREMDES AS NEEDED      Dr Laqueta Due  [phone]

## 2022-04-11 NOTE — OR Surgeon (Signed)
[facility]      Patient Name: Tammy Kaufman, Tammy Kaufman Number: [id number]  Date of Service: 04/11/2022   Date of Birth: Aug 14, 1945      Pre-Operative Diagnosis: COLON SCREENING     Post-Operative Diagnosis: TETHERING OF SIGMOID COLON  GRADE 2 HEMORRHOIDS  MODERATE SIGMOID DIVERTICULOSIS, SCATTERED THROUGHOUT    Procedure(s)/Description:  COLONOSCOPY: [id number] (CPT)     Attending Surgeon: Willow Ora, MD     Anesthesia:  CRNA: Ardelle Anton, CRNA    Anesthesia Type: .General     Specimens Removed: NONE    The patient indicates that they have read and understood the preoperative colonoscopy consent form. The benefits, risks and alternatives to the procedure were discussed. I specifically discussed the risk of bleeding and/or perforation requiring operation.  The patient was given ample opportunity to ask questions which were addressed to the patient's satisfaction.  The patient indicates they have no further question and wish to proceed. Informed consent was obtained from the patient and/or medical power of attorney.    The patient was brought into the procedure room and placed on the table in the left lateral decubitus position. After IV sedation was given, full finger digital rectal examination was performed with a circumferential sweep of the distal rectal mucosa. Subsequently, the flexible colonoscope was inserted into the rectum and passed without any difficulty. The colonoscope was then advanced up into the sigmoid colon, descending colon, transverse colon, right colon and cecum without any difficulty. Gross examination of each section of the colon was performed showing no specific abnormalities seen. Cecal intubation was achieved and the appendiceal orifice and ileocecal valve were identified. The colonoscope was withdrawn carefully examining the mucosa as the scope was being extracted with particular attention paid to the proximal sides of folds, flexures, bends and rectal valves. At  approximately 10 cm. from the anal verge, the colonoscope was retroflexed to fully examine the distal rectum. The colonoscope was removed and a repeat digital rectal examination was performed at the completion of the procedure. The patient tolerated the procedure well. No intraoperative complications were encountered.    EKG, pulse, pulse oximetry and blood pressure were monitored throughout the entire procedure.  There were no unplanned events.  The patient was instructed to contact me if they have any problems with their colon such as bleeding, pain or changes in bowel habits. They understood and agreed to do so.  The patient will not need another screening colonoscopy for 10 years which is according to ASGE guidelines. However, if in the future the patient has any problems with abdominal pain, changes in bowel habits, blood in stool, etc., then they should contact me because they may be a candidate for diagnostic colonoscopy before the 10 year time limit.    Dontavia Brand B. Meagon Duskin, MD, MBA, [facility]

## 2022-04-11 NOTE — Anesthesia Postprocedure Evaluation (Signed)
Anesthesia Post Op Evaluation    Patient: Tammy Kaufman  Procedure(s):  COLONOSCOPY    Last Vitals:Temperature: 36.8 C (98.3 F) (04/11/22 0700)  Heart Rate: 92 (04/11/22 0700)  BP (Non-Invasive): (!) 149/84 (04/11/22 0700)  Respiratory Rate: 20 (04/11/22 0700)  SpO2: 96 % (04/11/22 0700)    No notable events documented.    Patient is sufficiently recovered from the effects of anesthesia to participate in the evaluation and has returned to their pre-procedure level.  Patient location during evaluation: PACU       Patient participation: complete - patient participated  Level of consciousness: awake and alert and responsive to verbal stimuli    Pain management: adequate  Airway patency: patent    Anesthetic complications: no  Cardiovascular status: acceptable  Respiratory status: acceptable  Hydration status: acceptable  Patient post-procedure temperature: Pt Normothermic   PONV Status: Absent

## 2022-04-12 ENCOUNTER — Telehealth (INDEPENDENT_AMBULATORY_CARE_PROVIDER_SITE_OTHER): Payer: Self-pay | Admitting: Surgery

## 2023-10-09 ENCOUNTER — Other Ambulatory Visit (HOSPITAL_COMMUNITY): Payer: Self-pay | Admitting: Family Medicine

## 2023-10-09 DIAGNOSIS — M545 Low back pain, unspecified: Secondary | ICD-10-CM

## 2023-10-09 DIAGNOSIS — Z1231 Encounter for screening mammogram for malignant neoplasm of breast: Secondary | ICD-10-CM

## 2023-10-09 DIAGNOSIS — R1011 Right upper quadrant pain: Secondary | ICD-10-CM

## 2023-10-10 ENCOUNTER — Encounter (HOSPITAL_COMMUNITY): Payer: Self-pay

## 2023-10-10 ENCOUNTER — Ambulatory Visit
Admission: RE | Admit: 2023-10-10 | Discharge: 2023-10-10 | Disposition: A | Discharge status: Home / Self Care | Payer: Self-pay | Source: Ambulatory Visit | Attending: Family Medicine | Admitting: Family Medicine

## 2023-10-10 ENCOUNTER — Other Ambulatory Visit: Payer: Self-pay

## 2023-10-10 DIAGNOSIS — Z1231 Encounter for screening mammogram for malignant neoplasm of breast: Secondary | ICD-10-CM | POA: Insufficient documentation

## 2023-11-14 ENCOUNTER — Ambulatory Visit (HOSPITAL_COMMUNITY): Payer: Self-pay

## 2024-01-01 ENCOUNTER — Ambulatory Visit
Admission: RE | Admit: 2024-01-01 | Discharge: 2024-01-01 | Disposition: A | Source: Ambulatory Visit | Attending: Family Medicine

## 2024-01-01 ENCOUNTER — Other Ambulatory Visit: Payer: Self-pay

## 2024-01-01 DIAGNOSIS — M545 Low back pain, unspecified: Secondary | ICD-10-CM | POA: Insufficient documentation

## 2024-01-01 DIAGNOSIS — R1011 Right upper quadrant pain: Secondary | ICD-10-CM | POA: Insufficient documentation
